# Patient Record
Sex: Male | Born: 1985 | Race: White | Hispanic: No | Marital: Married | Smoking: Never smoker
Health system: Southern US, Community
[De-identification: ages and names within clinical notes are randomized; demographics above are authoritative.]

## PROBLEM LIST (undated history)

## (undated) DIAGNOSIS — T7840XA Allergy, unspecified, initial encounter: Secondary | ICD-10-CM

## (undated) HISTORY — DX: Allergy, unspecified, initial encounter: T78.40XA

---

## 2014-08-20 ENCOUNTER — Ambulatory Visit (INDEPENDENT_AMBULATORY_CARE_PROVIDER_SITE_OTHER): Payer: 59

## 2014-08-20 ENCOUNTER — Ambulatory Visit (INDEPENDENT_AMBULATORY_CARE_PROVIDER_SITE_OTHER): Payer: 59 | Admitting: Emergency Medicine

## 2014-08-20 VITALS — BP 112/84 | HR 102 | Temp 98.9°F | Resp 16 | Ht 70.5 in | Wt 225.8 lb

## 2014-08-20 DIAGNOSIS — R062 Wheezing: Secondary | ICD-10-CM | POA: Diagnosis not present

## 2014-08-20 DIAGNOSIS — J209 Acute bronchitis, unspecified: Secondary | ICD-10-CM

## 2014-08-20 LAB — POCT CBC
Granulocyte percent: 67.8 %G (ref 37–80)
HEMATOCRIT: 49.7 % (ref 43.5–53.7)
Hemoglobin: 16.8 g/dL (ref 14.1–18.1)
LYMPH, POC: 2 (ref 0.6–3.4)
MCH, POC: 27.5 pg (ref 27–31.2)
MCHC: 33.8 g/dL (ref 31.8–35.4)
MCV: 81.6 fL (ref 80–97)
MID (cbc): 0.4 (ref 0–0.9)
MPV: 7.2 fL (ref 0–99.8)
POC Granulocyte: 5.2 (ref 2–6.9)
POC LYMPH %: 26.3 % (ref 10–50)
POC MID %: 5.9 % (ref 0–12)
Platelet Count, POC: 294 10*3/uL (ref 142–424)
RBC: 6.09 M/uL (ref 4.69–6.13)
RDW, POC: 12.7 %
WBC: 7.6 10*3/uL (ref 4.6–10.2)

## 2014-08-20 MED ORDER — BENZONATATE 100 MG PO CAPS: 100.0000 mg | ORAL_CAPSULE | Freq: Three times a day (TID) | ORAL | Status: DC | PRN

## 2014-08-20 MED ORDER — ALBUTEROL SULFATE HFA 108 (90 BASE) MCG/ACT IN AERS: 2.0000 | INHALATION_SPRAY | RESPIRATORY_TRACT | Status: DC | PRN

## 2014-08-20 MED ORDER — HYDROCOD POLST-CPM POLST ER 10-8 MG/5ML PO SUER: 5.0000 mL | Freq: Every evening | ORAL | Status: DC | PRN

## 2014-08-20 NOTE — Progress Notes (Addendum)
Subjective:    Patient ID: Ryan Sampson, male    DOB: 05-Apr-1985, 29 y.o.   MRN: [id number]  HPI Patient presents for productive cough that has been present for the past 2 weeks and is worse at night. Now has wheezing as well. Denies fever, sinus pressure, congestion, SOB, CP, N/V, otalgia, or headache. H/o seasonal allergies, but no asthma. Has taken over-the-counter cough medication without relief. No known sick contacts. Has never smoked.   Review of Systems  Constitutional: Negative for fever, chills, activity change, appetite change and fatigue.  HENT: Negative for congestion, ear pain, postnasal drip, rhinorrhea, sinus pressure, sneezing and sore throat.   Respiratory: Positive for cough and wheezing. Negative for chest tightness and shortness of breath.   Cardiovascular: Negative for chest pain.  Gastrointestinal: Negative for nausea and vomiting.  Neurological: Negative for dizziness and headaches.       Objective:   Physical Exam  Constitutional: He is oriented to person, place, and time. He appears well-developed and well-nourished. No distress.  Blood pressure 112/84, pulse 102, temperature 98.9 F (37.2 C), temperature source Oral, resp. rate 16, height 5' 10.5" (1.791 m), weight 225 lb 12.8 oz (102.422 kg), SpO2 98 %.  HENT:  Head: Normocephalic and atraumatic.  Right Ear: Tympanic membrane, external ear and ear canal normal.  Left Ear: Tympanic membrane and ear canal normal.  Nose: No mucosal edema or rhinorrhea. Right sinus exhibits no maxillary sinus tenderness and no frontal sinus tenderness. Left sinus exhibits no maxillary sinus tenderness and no frontal sinus tenderness.  Mouth/Throat: Uvula is midline, oropharynx is clear and moist and mucous membranes are normal. No oropharyngeal exudate, posterior oropharyngeal edema or posterior oropharyngeal erythema.  Eyes: Conjunctivae are normal. Pupils are equal, round, and reactive to light. Right eye exhibits no  discharge. Left eye exhibits no discharge. No scleral icterus.  Neck: Normal range of motion. Neck supple. No thyromegaly present.  Cardiovascular: Normal rate, regular rhythm and normal heart sounds.  Exam reveals no gallop and no friction rub.   No murmur heard. Pulmonary/Chest: Effort normal. No respiratory distress. He has no decreased breath sounds. He has wheezes in the right upper field and the right middle field. He has no rhonchi. He has no rales. He exhibits no tenderness.  Abdominal: Soft. Bowel sounds are normal. He exhibits no distension and no mass. There is no tenderness. There is no rebound and no guarding.  Lymphadenopathy:    He has no cervical adenopathy.  Neurological: He is alert and oriented to person, place, and time.  Skin: Skin is warm and dry. No rash noted. He is not diaphoretic. No erythema. No pallor.   Results for orders placed or performed in visit on 08/20/14  POCT CBC  Result Value Ref Range   WBC 7.6 4.6 - 10.2 K/uL   Lymph, poc 2.0 0.6 - 3.4   POC LYMPH PERCENT 26.3 10 - 50 %L   MID (cbc) 0.4 0 - 0.9   POC MID % 5.9 0 - 12 %M   POC Granulocyte 5.2 2 - 6.9   Granulocyte percent 67.8 37 - 80 %G   RBC 6.09 4.69 - 6.13 M/uL   Hemoglobin 16.8 14.1 - 18.1 g/dL   HCT, POC 62.9 52.8 - 53.7 %   MCV 81.6 80 - 97 fL   MCH, POC 27.5 27 - 31.2 pg   MCHC 33.8 31.8 - 35.4 g/dL   RDW, POC 41.3 %   Platelet Count, POC 294 142 -  424 K/uL   MPV 7.2 0 - 99.8 fL  [facility] reading (PRIMARY) by  Dr. Dareen Piano. No acute cardiopulmonary changes.     Assessment & Plan:  1. Wheezing 2. Acute bronchitis, unspecified organism Increase water intake.  - DG Chest 2 View; Future - POCT CBC - albuterol (PROVENTIL HFA;VENTOLIN HFA) 108 (90 BASE) MCG/ACT inhaler; Inhale 2 puffs into the lungs every 4 (four) hours as needed for wheezing or shortness of breath (cough, shortness of breath or wheezing.).  Dispense: 1 Inhaler; Refill: 1 - benzonatate (TESSALON) 100 MG capsule; Take 1-2  capsules (100-200 mg total) by mouth 3 (three) times daily as needed for cough.  Dispense: 40 capsule; Refill: 0 - chlorpheniramine-HYDROcodone (TUSSIONEX PENNKINETIC ER) 10-8 MG/5ML SUER; Take 5 mLs by mouth at bedtime as needed for cough.  Dispense: 75 mL; Refill: 0   Rollande Thursby PA-C  [facility] 08/20/2014 5:17 PM

## 2014-08-20 NOTE — Patient Instructions (Signed)

## 2014-08-28 NOTE — Progress Notes (Signed)
  Medical screening examination/treatment/procedure(s) were performed by non-physician practitioner and as supervising physician I was immediately available for consultation/collaboration.     

## 2014-11-26 ENCOUNTER — Encounter: Payer: Self-pay | Admitting: Allergy and Immunology

## 2014-11-26 ENCOUNTER — Ambulatory Visit (INDEPENDENT_AMBULATORY_CARE_PROVIDER_SITE_OTHER): Payer: 59 | Admitting: Allergy and Immunology

## 2014-11-26 VITALS — BP 128/94 | HR 76 | Temp 98.8°F | Resp 16 | Ht 70.28 in | Wt 233.7 lb

## 2014-11-26 DIAGNOSIS — R059 Cough, unspecified: Secondary | ICD-10-CM

## 2014-11-26 DIAGNOSIS — T7804XD Anaphylactic reaction due to fruits and vegetables, subsequent encounter: Secondary | ICD-10-CM | POA: Diagnosis not present

## 2014-11-26 DIAGNOSIS — R05 Cough: Secondary | ICD-10-CM

## 2014-11-26 DIAGNOSIS — L209 Atopic dermatitis, unspecified: Secondary | ICD-10-CM

## 2014-11-26 MED ORDER — MOMETASONE FUROATE 0.1 % EX CREA: TOPICAL_CREAM | CUTANEOUS | Status: DC

## 2014-11-26 MED ORDER — TACROLIMUS 0.1 % EX OINT: TOPICAL_OINTMENT | CUTANEOUS | Status: DC

## 2014-11-26 MED ORDER — EPINEPHRINE 0.3 MG/0.3ML IJ SOAJ: INTRAMUSCULAR | Status: AC

## 2014-11-26 NOTE — Patient Instructions (Addendum)
Take Home Sheet  1. Avoidance: Mite and Pollen and Kiwi as previously.   2. Antihistamine: Zyrtec 10mg  by mouth once daily for runny nose or itching.   3. Nasal Spray: Saline 2 spray(s) each nostril once daily for stuffy nose or drainage.   4.  Consider aeroallergen or selected food testing as discussed.  5.  Moisturize skin 2-4 times daily.--Consider Aveeno, Cetaphil, Vanicream.  6.  Protopic ointment twice daily as needed to rash areas.      Mometasone cream once daily as needed to rash areas (none to face).  7.  Epi-pen/Benadryl as needed.  8. Follow up Visit: 3 months or sooner if needed.                                Call back earlier if new concerns regarding cough as discussed.   Websites that have reliable Patient information: 1. American Academy of Asthma, Allergy, & Immunology: www.aaaai.org 2. Food Allergy Network: www.foodallergy.org 3. Mothers of Asthmatics: www.aanma.org 4. [facility]: https://www.strong.com/www.njc.org 5. American College of Allergy, Asthma, & Immunology: BiggerRewards.iswww.allergy.mcg.edu or www.acaai.org  Control of House Dust Mite Allergen  House dust mites play a major role in allergic asthma and rhinitis.  They occur in environments with high humidity wherever human skin, the food for dust mites is found. High levels have been detected in dust obtained from mattresses, pillows, carpets, upholstered furniture, bed covers, clothes and soft toys.  The principal allergen of the house dust mite is found in its feces.  A gram of dust may contain 1,000 mites and 250,000 fecal particles.  Mite antigen is easily measured in the air during house cleaning activities.  1. Encase mattresses, including the box spring, and pillow, in an air tight cover.  Seal the zipper end of the encased mattresses with wide adhesive tape. 2. Wash the bedding in water of 130 degrees Farenheit weekly.  Avoid cotton comforters/quilts and flannel bedding: the most ideal bed  covering is the dacron comforter. 3. Remove all upholstered furniture from the bedroom. 4. Remove carpets, carpet padding, rugs, and non-washable window drapes from the bedroom.  Wash drapes weekly or use plastic window coverings. 5. Remove all non-washable stuffed toys from the bedroom.  Wash stuffed toys weekly. 6. Have the room cleaned frequently with a vacuum cleaner and a damp dust-mop.  The patient should not be in a room which is being cleaned and should wait 1 hour after cleaning before going into the room. 7. Close and seal all heating outlets in the bedroom.  Otherwise, the room will become filled with dust-laden air.  An electric heater can be used to heat the room. 8. Reduce indoor humidity to less than 50%.  Do not use a humidifier.  Reducing Pollen Exposure  The American Academy of Allergy, Asthma and Immunology suggests the following steps to reduce your exposure to pollen during allergy seasons.  9. Do not hang sheets or clothing out to dry; pollen may collect on these items. 10. Do not mow lawns or spend time around freshly cut grass; mowing stirs up pollen. 11. Keep windows closed at night.  Keep car windows closed while driving. 12. Minimize morning activities outdoors, a time when pollen counts are usually at their highest. 13. Stay indoors as much as possible when pollen counts or humidity is high and on windy days when pollen tends to remain in the air longer. 14. Use air  conditioning when possible.  Many air conditioners have filters that trap the pollen spores. 15. Use a HEPA room air filter to remove pollen form the indoor air you breathe.  Control of Mold Allergen  Mold and fungi can grow on a variety of surfaces provided certain temperature and moisture conditions exist.  Outdoor molds grow on plants, decaying vegetation and soil.  The major outdoor mold, Alternaria dn Cladosporium, are found in very high numbers during hot and dry conditions.  Generally, a late  Summer - Fall peak is seen for common outdoor fungal spores.  Rain will temporarily lower outdoor mold spore count, but counts rise rapidly when the rainy period ends.  The most important indoor molds are Aspergillus and Penicillium.  Dark, humid and poorly ventilated basements are ideal sites for mold growth.  The next most common sites of mold growth are the bathroom and the kitchen.  Outdoor [employer] 1. Use air conditioning and keep windows closed 2. Avoid exposure to decaying vegetation. 3. Avoid leaf raking. 4. Avoid grain handling. 5. Consider wearing a face mask if working in moldy areas.  Indoor Mold Control 1. Maintain humidity below 50%. 2. Clean washable surfaces with 5% bleach solution. 3. Remove sources e.g. Contaminated carpets.  Control of Cockroach Allergen  Cockroach allergen has been identified as an important cause of acute attacks of asthma, especially in urban settings.  There are fifty-five species of cockroach that exist in the [place], however only three, the [place], [place] species produce allergen that can affect patients with Asthma.  Allergens can be obtained from fecal particles, egg casings and secretions from cockroaches.  1. Remove food sources. 2. Reduce access to water. 3. Seal access and entry points. 4. Spray runways with 0.5-1% Diazinon or Chlorpyrifos 5. Blow boric acid power under stoves and refrigerator. 6. Place bait stations (hydramethylnon) at feeding sites.

## 2014-11-26 NOTE — Progress Notes (Signed)
NEW PATIENT NOTE  RE: Ryan Sampson MRN: [id number] DOB: 08-04-1985 [facility] [place] [address] [place] [address] Date of Office Visit: 11/26/2014  Referring provider: No referring provider defined for this encounter.  Subjective:  Ryan Sampson is a 29 y.o. male who presents with rash history. Assessment:   1. Atopic dermatitis  (2010).  2. Cough, improved.  3. Kiwi allergy.  4.      History of nickel allergy/ contact dermatitis.  Plan:   Meds ordered this encounter  Medications  . tacrolimus (PROTOPIC) 0.1 % ointment    Sig: Apply to red rash areas once daily as needed.    Dispense:  100 g    Refill:  1  . mometasone (ELOCON) 0.1 % cream    Sig: Apply to red rash areas at body once daily as needed.    Dispense:  45 g    Refill:  1  . EPINEPHrine (EPIPEN 2-PAK) 0.3 mg/0.3 mL IJ SOAJ injection    Sig: Use as directed for a severe allergic reaction.    Dispense:  2 Device    Refill:  2   Patient Instructions   1. Avoidance: Mite and Pollen and Kiwi as previously. 2. Antihistamine: Zyrtec  by mouth once daily for runny nose or itching. 3. Nasal Spray: Saline 2 spray(s) each nostril once daily for stuffy nose or drainage.  4.  Consider aeroallergen or selected food testing as discussed. 5.  Moisturize skin 2-4 times daily.--Consider Aveeno, Cetaphil, Vanicream. 6.  Protopic ointment twice daily as needed to rash areas.      Mometasone cream once daily as needed to rash areas (none to face). 7.  Epi-pen/Benadryl as needed. 8. Follow up Visit: 3 months or sooner if needed.                                Call back earlier if new concerns regarding cough as discussed.   HPI: Ryan Sampson presents with history of rash concerns over the last 6 years.  Generally he has dry skin though no history of childhood sensitive skin or eczema.  He uses lotion (Aveeda or Microbiologist)  most days  and prescription steroid cream from his allergist in [place].  He reports after the allergy evaluation which included positive nickel Patch testing, he has been avoiding not only topical nickel but minimizing various foods which have high nickel content.  He finds the food restrictions difficult and understood that those various exposures were contributing to his continued skin flares.  He describes increasing difficulties at wrists, thighs, legs especially at sock and boot line.  He does not note specific food reactions but unrelated to skin difficulties kiwi triggers an itchy mouth/throat sensation, without hives, swelling of lips/tongue or throat, respiratory or GI symptoms.  He may find worsening skin difficulties when outdoors or with fluctuant weather patterns.  He has also noted in the last few years occasional rhinorrhea, nasal congestion, sneezing and itchy watery eyes particularly in the spring season.  Allegra has been beneficial as well as Clobetasol and slight benefit from Locoid cream.  He recently completed several meds related to respiratory infection with cough which included albuterol.  He denies any wheeze, shortness of breath, difficulty in breathing or current chest congestion. His sleep and activity are normal.       Medical History: Past Medical History  Diagnosis Date  1.  History of wrist fracture. 2.  Recent respiratory infection--completed oral medications and received albuterol.  Surgical History: Oral surgery (teeth extraction).  Family History: Family History  Problem Relation Age of Onset  . Cancer Mother   . Mental illness Mother   . Diabetes Father   . Hyperlipidemia Father   . Diabetes Paternal Grandmother   . Hyperlipidemia Paternal Grandmother   . Lupus Maternal Aunt   . Allergic rhinitis Neg Hx   . Angioedema Neg Hx   . Asthma Neg Hx   . Atopy Neg Hx   . Eczema Neg Hx   . Immunodeficiency Neg Hx   . Urticaria Neg Hx    Social History: Social History   . Marital Status: Married    Spouse Name: N/A  . Number of Children: N/A  . Years of Education: N/A   Occupational History  . Automative salesman   Social History Main Topics  . Smoking status: Never Smoker   . Smokeless tobacco: Never Used  . Alcohol Use: 0.0 oz/week    0 Standard drinks or equivalent per week  . Drug Use: No  . Sexual Activity: Not on file   Social History Narrative  Ryan Sampson is married non-smoker with occasional alcohol ingestion.  Medications prior to this encounter: 1. Betamethasone Valerate. Outpatient Prescriptions Prior to Visit  Medication Sig Dispense Refill  . albuterol (PROVENTIL HFA;VENTOLIN HFA) 108 (90 BASE) MCG/ACT inhaler Inhale 2 puffs into the lungs every 4 (four) hours as needed for wheezing or shortness of breath (cough, shortness of breath or wheezing.). (Patient not taking: Reported on 11/26/2014) 1 Inhaler 1  . benzonatate (TESSALON) 100 MG capsule Take 1-2 capsules (100-200 mg total) by mouth 3 (three) times daily as needed for cough. (Patient not taking: Reported on 11/26/2014) 40 capsule 0  . chlorpheniramine-HYDROcodone (TUSSIONEX PENNKINETIC ER) 10-8 MG/5ML SUER Take 5 mLs by mouth at bedtime as needed for cough. (Patient not taking: Reported on 11/26/2014) 75 mL 0   Drug Allergies: Allergies  Allergen Reactions  . Kiwi Extract   . Nickel     Environmental History: Ryan Sampson lives in a 30 year old apartment over 18 months with hardwood floors, central air and heat, humidifier and cat in the home.    Review of Systems  Constitutional: Negative for fever, weight loss and malaise/fatigue.  HENT: Positive for congestion. Negative for ear pain, hearing loss, nosebleeds and sore throat.        Corrective eyeglass lenses > 20 years.  Eyes: Negative for discharge and redness.  Respiratory: Negative for shortness of breath.        Denies history of bronchitis and pneumonia.  Gastrointestinal: Negative for heartburn, nausea, vomiting,  abdominal pain, diarrhea and constipation.  Genitourinary: Negative.   Musculoskeletal: Negative for myalgias and joint pain.  Skin: Negative.  Negative for itching and rash.  Neurological: Positive for headaches. Negative for dizziness, seizures and weakness.  Endo/Heme/Allergies: Positive for environmental allergies.       Reports sensitivity to nickel (positive patch test). Denies sensitivity to aspirin, NSAIDs, stinging insects, foods, latex and cosmetics.   Objective:   Filed Vitals:   11/26/14 1351 11/26/14 1455  BP: 142/80 128/94  Pulse: 76   Temp: 98.8 F (37.1 C)   Resp: 16    Physical Exam  Constitutional: He is well-developed, well-nourished, and in no distress.  HENT:  Head: Atraumatic.  Right Ear: Tympanic membrane and ear canal normal.  Left Ear: Tympanic membrane and ear canal normal.  Nose: Mucosal edema present. No rhinorrhea. No epistaxis.  Mouth/Throat: Oropharynx is clear and moist and mucous membranes are normal. No oropharyngeal exudate, posterior oropharyngeal edema or posterior oropharyngeal erythema.  Eyes: Conjunctivae are normal.  Neck: Neck supple.  Cardiovascular: Normal rate, S1 normal and S2 normal.   No murmur heard. Pulmonary/Chest: Effort normal and breath sounds normal. He has no wheezes. He has no rhonchi. He has no rales.  Abdominal: Soft. Bowel sounds are normal.  Lymphadenopathy:    He has no cervical adenopathy.  Neurological: He is alert.  Skin: Skin is warm, dry and intact. Rash (patchy erythematous eczematous areas with scattered papular scaling bilateral lower extremites and  wrists) noted. Rash is not pustular and not urticarial. No cyanosis. Nails show no clubbing.   Diagnostics: Spirometry FVC 5.03--94%, FEV1 4.13--93%.    Ryan Sampson M. Willa Rough, MD

## 2015-02-11 ENCOUNTER — Ambulatory Visit: Payer: 59 | Admitting: Allergy and Immunology

## 2015-06-25 ENCOUNTER — Encounter: Payer: Self-pay | Admitting: Allergy and Immunology

## 2015-06-25 ENCOUNTER — Ambulatory Visit (INDEPENDENT_AMBULATORY_CARE_PROVIDER_SITE_OTHER): Payer: 59 | Admitting: Allergy and Immunology

## 2015-06-25 VITALS — BP 120/90 | HR 88 | Temp 98.0°F | Resp 16

## 2015-06-25 DIAGNOSIS — L209 Atopic dermatitis, unspecified: Secondary | ICD-10-CM

## 2015-06-25 MED ORDER — HYDROXYZINE HCL 10 MG PO TABS
ORAL_TABLET | ORAL | Status: AC
Start: 2015-06-25 — End: ?

## 2015-06-25 MED ORDER — TACROLIMUS 0.1 % EX OINT: TOPICAL_OINTMENT | CUTANEOUS | Status: AC

## 2015-06-25 MED ORDER — MOMETASONE FUROATE 0.1 % EX CREA: TOPICAL_CREAM | CUTANEOUS | Status: AC

## 2015-06-25 NOTE — Patient Instructions (Signed)
   Use Elocon to rash areas each morning.  Use Protopic to rash areas each evening.  Zyrtec 10 mg each morning.  As needed hydroxyzine 10 mg 1-2 tablets at bedtime.  EpiPen/Benadryl/albuterol as needed.  Follow-up in 6 months or sooner if needed.

## 2015-06-25 NOTE — Progress Notes (Signed)
FOLLOW UP NOTE  RE: Ryan Sampson MRN: [id number] DOB: 1985-08-30 [facility] [place] [address] [place] [id number] Date of Office Visit: 06/25/2015  Subjective:  Ryan Sampson is a 30 y.o. male who presents today for Eczema  Assessment:   1. Atopic dermatitis, active rash today.   2.      Incomplete medication management. 3.      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 4.      Nickel and kiwi allergy ---avoidance and emergency action plan in place. Plan:   Meds ordered this encounter  Medications  . mometasone (ELOCON) 0.1 % cream    Sig: Apply to red rash areas at body twice daily as needed. DO NOT APPLY TO FACE.    Dispense:  50 g    Refill:  2  . tacrolimus (PROTOPIC) 0.1 % ointment    Sig: Apply to red rash areas twice daily as needed.    Dispense:  100 g    Refill:  1  . hydrOXYzine (ATARAX/VISTARIL) 10 MG tablet    Sig: 1-2 tablets each evening at bedtime as needed for itching.    Dispense:  60 tablet    Refill:  1  1.  Use Elocon to rash areas each morning. 2.  Use Protopic to rash areas each evening and increase to twice daily as needed. 3.  Zyrtec 10 mg each morning. 4.  As needed hydroxyzine 10 mg 1-2 tablets at bedtime---will use consistently over the next week until skin flare is decreased. 5.  EpiPen/Benadryl/albuterol as needed. 6.  Sunscreen regularly. 7.  Follow-up in 6 months or sooner if needed.  HPI: Ryan Sampson returns to the office with his wife in follow-up of atopic dermatitis.  Since his initial visit, he notes only partial improvement but did not obtain the complete topical regime from the pharmacy. I reviewed with pharmacy today and they have the Protopic available for him (cost only $10).  His wife notices slightly more itching then he does, which on occasion is nocturnally.  He notices the Zyrtec manages his nose very well and has no other complaints or concerns.  Continues to avoid Kiwi and  minimize nickel exposures. Denies ED or urgent care visits, prednisone or antibiotic courses. Reports sleep and activity are normal.    Ryan Sampson has a current medication list which includes the following prescription(s): epinephrine, mometasone.  Drug Allergies: Allergies  Allergen Reactions  . Kiwi Extract   . Nickel    Objective:   Filed Vitals:   06/25/15 1057  BP: 120/90  Pulse: 88  Temp: 98 F (36.7 C)  Resp: 16   SpO2 Readings from Last 1 Encounters:  06/25/15 97%   Physical Exam  Constitutional: He is well-developed, well-nourished, and in no distress.  HENT:  Head: Atraumatic.  Right Ear: Tympanic membrane and ear canal normal.  Left Ear: Tympanic membrane and ear canal normal.  Nose: Mucosal edema present. No rhinorrhea. No epistaxis.  Mouth/Throat: Oropharynx is clear and moist and mucous membranes are normal. No oropharyngeal exudate, posterior oropharyngeal edema or posterior oropharyngeal erythema.  Eyes: Conjunctivae are normal.  Neck: Neck supple.  Cardiovascular: Normal rate, S1 normal and S2 normal.   No murmur heard. Pulmonary/Chest: Effort normal and breath sounds normal. He has no wheezes. He has no rhonchi. He has no rales.  Lymphadenopathy:    He has no cervical adenopathy.  Skin: Skin is warm and intact. Rash (papular patchy erythematous dry areas at lower extremities bilaterally  right>left and abdomen mostly periumbilically and wrists bilaterally left> right. ) noted. No cyanosis. Nails show no clubbing.     Ryan Sampson M. Willa Rough, MD  cc: No PCP Per Patient

## 2015-12-23 ENCOUNTER — Ambulatory Visit: Payer: 59 | Admitting: Allergy

## 2015-12-31 ENCOUNTER — Ambulatory Visit: Payer: 59 | Admitting: Allergy

## 2017-01-21 IMAGING — CR DG CHEST 2V
2 series · 2 of 2 positions shown · non-contrast
Comparison: None.

CLINICAL DATA: Cough and wheezing. Shortness of breath. Initial
encounter.

EXAM:
CHEST  2 VIEW

[PA]
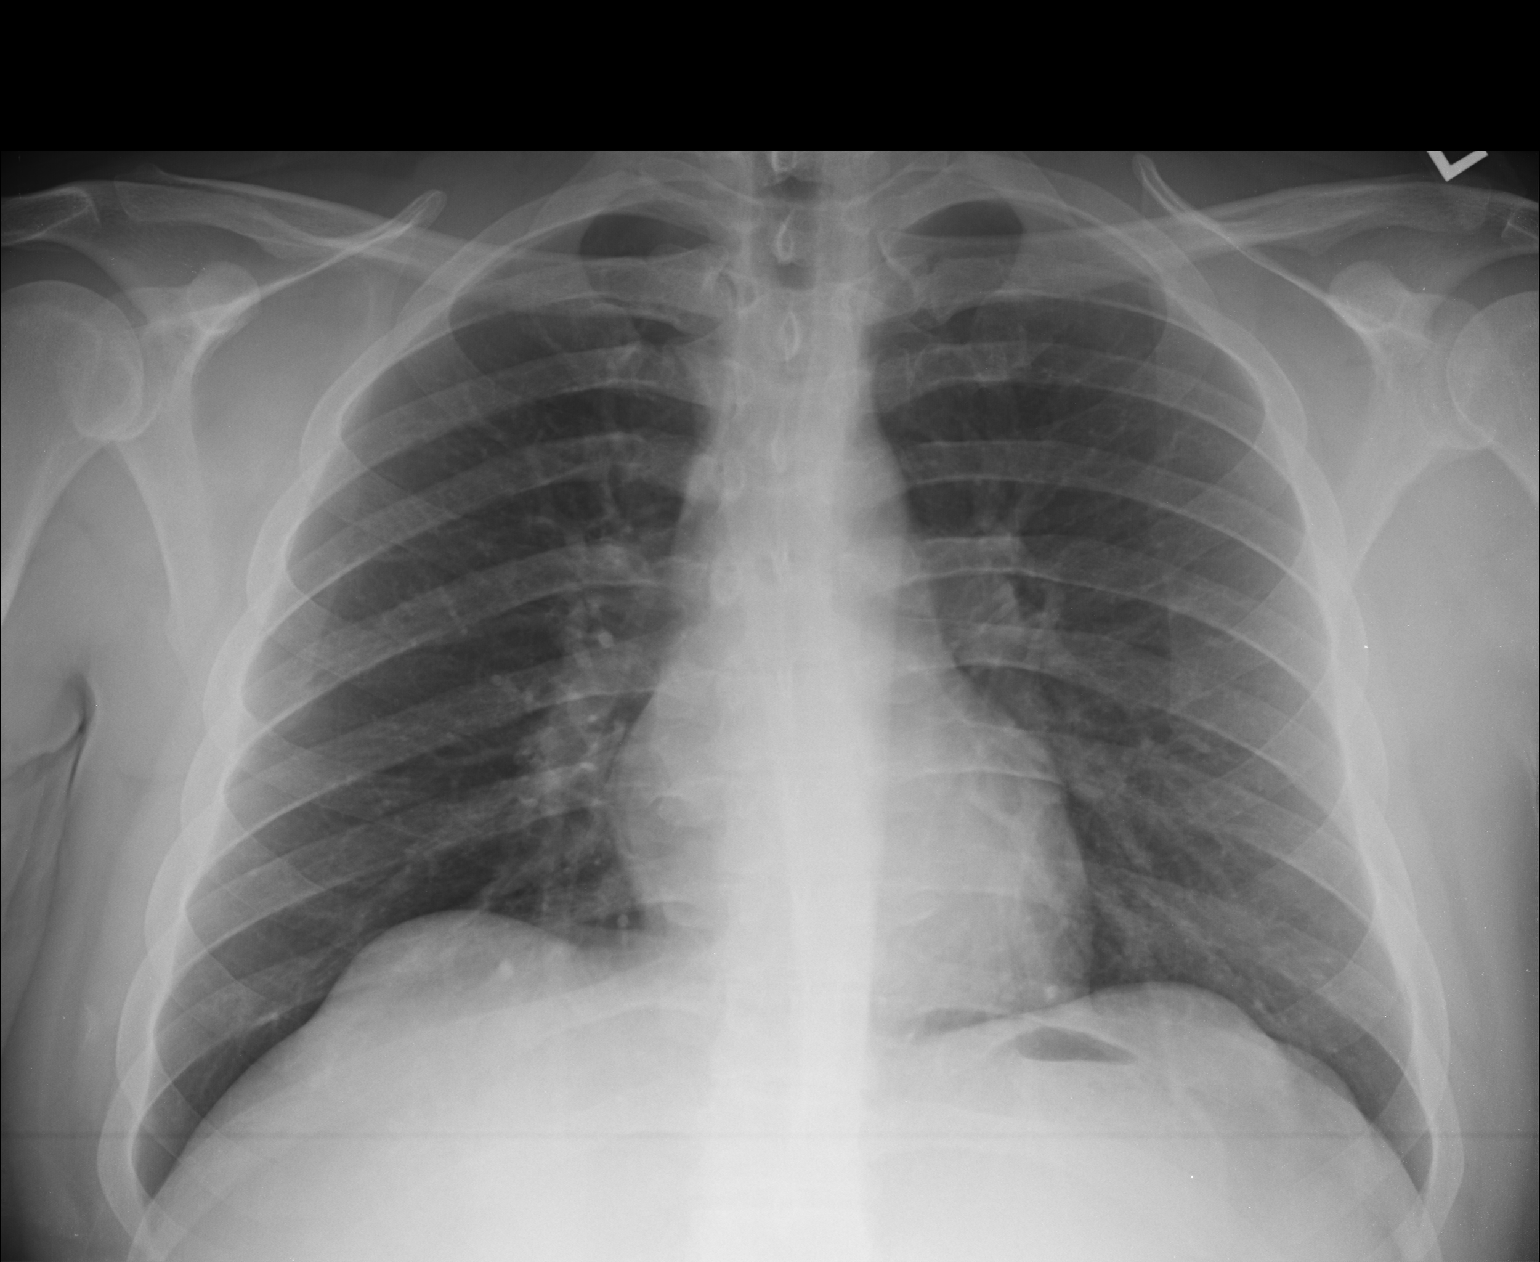

[lateral]
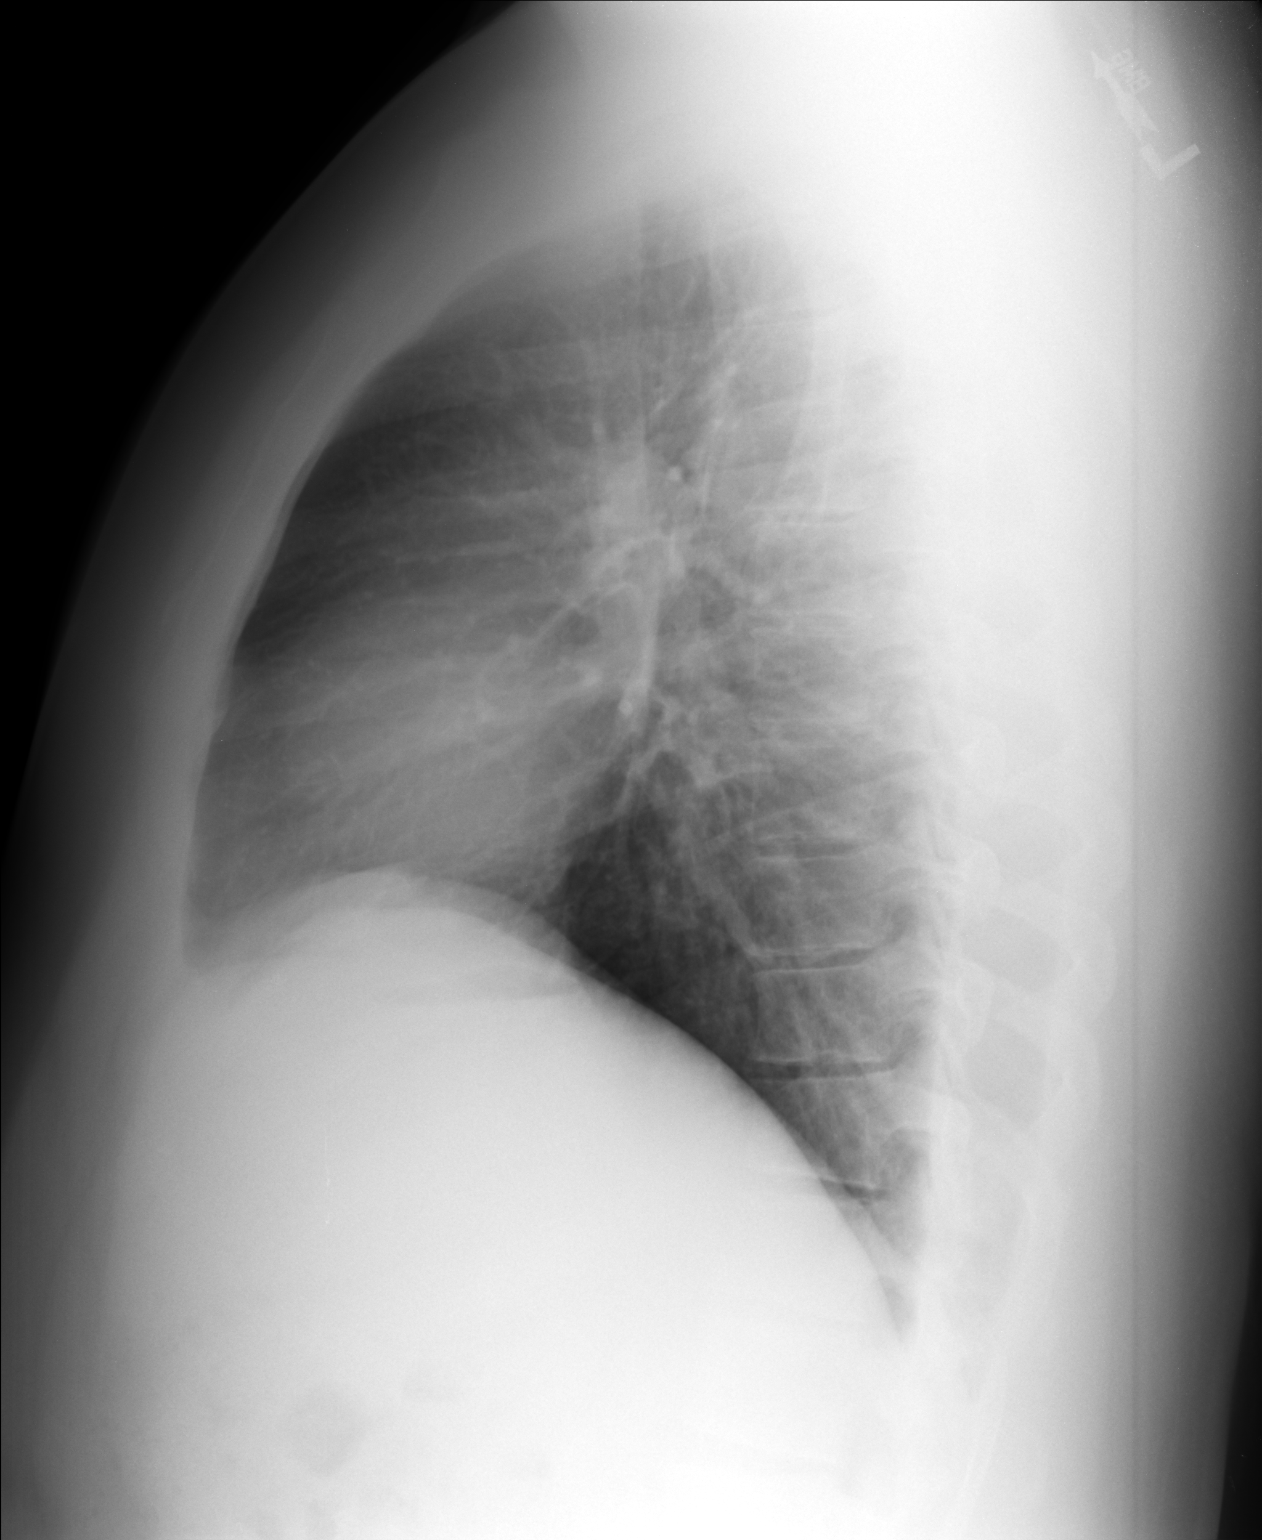

[2 of 2 positions shown; findings below may reference images not displayed]

FINDINGS: Cardiopericardial silhouette within normal limits. Mediastinal
contours normal. Trachea midline. No airspace disease or effusion.
IMPRESSION: No active cardiopulmonary disease.
